# Patient Record
Sex: Female | Born: 1970 | Hispanic: No | Marital: Married
Health system: Southern US, Community
[De-identification: ages and names within clinical notes are randomized; demographics above are authoritative.]

## PROBLEM LIST (undated history)

## (undated) DIAGNOSIS — I10 Essential (primary) hypertension: Secondary | ICD-10-CM

## (undated) DIAGNOSIS — E119 Type 2 diabetes mellitus without complications: Secondary | ICD-10-CM

---

## 2009-01-21 ENCOUNTER — Encounter: Admission: RE | Admit: 2009-01-21 | Discharge: 2009-01-21 | Payer: Self-pay | Admitting: Obstetrics and Gynecology

## 2009-04-27 ENCOUNTER — Ambulatory Visit (HOSPITAL_COMMUNITY): Admission: RE | Admit: 2009-04-27 | Discharge: 2009-04-27 | Payer: Self-pay | Admitting: Obstetrics and Gynecology

## 2009-04-30 ENCOUNTER — Ambulatory Visit (HOSPITAL_COMMUNITY): Admission: RE | Admit: 2009-04-30 | Discharge: 2009-04-30 | Payer: Self-pay | Admitting: Obstetrics and Gynecology

## 2009-05-07 ENCOUNTER — Encounter (INDEPENDENT_AMBULATORY_CARE_PROVIDER_SITE_OTHER): Payer: Self-pay | Admitting: Obstetrics and Gynecology

## 2009-05-07 ENCOUNTER — Inpatient Hospital Stay (HOSPITAL_COMMUNITY): Admission: AD | Admit: 2009-05-07 | Discharge: 2009-05-09 | Payer: Self-pay | Admitting: Obstetrics and Gynecology

## 2010-09-06 LAB — CBC
HCT: 31.2 % — ABNORMAL LOW (ref 36.0–46.0)
HCT: 39.8 % (ref 36.0–46.0)
Hemoglobin: 10 g/dL — ABNORMAL LOW (ref 12.0–15.0)
Hemoglobin: 12.7 g/dL (ref 12.0–15.0)
MCHC: 32 g/dL (ref 30.0–36.0)
MCV: 72.9 fL — ABNORMAL LOW (ref 78.0–100.0)
Platelets: 95 10*3/uL — ABNORMAL LOW (ref 150–400)
RBC: 4.27 MIL/uL (ref 3.87–5.11)
RDW: 16 % — ABNORMAL HIGH (ref 11.5–15.5)
RDW: 16.2 % — ABNORMAL HIGH (ref 11.5–15.5)

## 2010-09-06 LAB — GLUCOSE, CAPILLARY
Glucose-Capillary: 112 mg/dL — ABNORMAL HIGH (ref 70–99)
Glucose-Capillary: 199 mg/dL — ABNORMAL HIGH (ref 70–99)
Glucose-Capillary: 222 mg/dL — ABNORMAL HIGH (ref 70–99)

## 2018-11-10 ENCOUNTER — Emergency Department (HOSPITAL_BASED_OUTPATIENT_CLINIC_OR_DEPARTMENT_OTHER): Payer: PRIVATE HEALTH INSURANCE

## 2018-11-10 ENCOUNTER — Other Ambulatory Visit: Payer: Self-pay

## 2018-11-10 ENCOUNTER — Emergency Department (HOSPITAL_BASED_OUTPATIENT_CLINIC_OR_DEPARTMENT_OTHER)
Admission: EM | Admit: 2018-11-10 | Discharge: 2018-11-10 | Disposition: A | Discharge status: Home / Self Care | Payer: PRIVATE HEALTH INSURANCE | Attending: Emergency Medicine | Admitting: Emergency Medicine

## 2018-11-10 ENCOUNTER — Encounter (HOSPITAL_BASED_OUTPATIENT_CLINIC_OR_DEPARTMENT_OTHER): Payer: Self-pay | Admitting: Emergency Medicine

## 2018-11-10 DIAGNOSIS — S5012XA Contusion of left forearm, initial encounter: Secondary | ICD-10-CM | POA: Diagnosis not present

## 2018-11-10 DIAGNOSIS — E119 Type 2 diabetes mellitus without complications: Secondary | ICD-10-CM | POA: Diagnosis not present

## 2018-11-10 DIAGNOSIS — Y998 Other external cause status: Secondary | ICD-10-CM | POA: Diagnosis not present

## 2018-11-10 DIAGNOSIS — Y93I9 Activity, other involving external motion: Secondary | ICD-10-CM | POA: Insufficient documentation

## 2018-11-10 DIAGNOSIS — Y92410 Unspecified street and highway as the place of occurrence of the external cause: Secondary | ICD-10-CM | POA: Insufficient documentation

## 2018-11-10 DIAGNOSIS — S0083XA Contusion of other part of head, initial encounter: Secondary | ICD-10-CM | POA: Diagnosis not present

## 2018-11-10 DIAGNOSIS — T07XXXA Unspecified multiple injuries, initial encounter: Secondary | ICD-10-CM

## 2018-11-10 DIAGNOSIS — S1091XA Abrasion of unspecified part of neck, initial encounter: Secondary | ICD-10-CM

## 2018-11-10 DIAGNOSIS — S1081XA Abrasion of other specified part of neck, initial encounter: Secondary | ICD-10-CM | POA: Insufficient documentation

## 2018-11-10 DIAGNOSIS — I10 Essential (primary) hypertension: Secondary | ICD-10-CM | POA: Insufficient documentation

## 2018-11-10 DIAGNOSIS — S199XXA Unspecified injury of neck, initial encounter: Secondary | ICD-10-CM | POA: Diagnosis present

## 2018-11-10 HISTORY — DX: Type 2 diabetes mellitus without complications: E11.9

## 2018-11-10 HISTORY — DX: Essential (primary) hypertension: I10

## 2018-11-10 LAB — PREGNANCY, URINE: Preg Test, Ur: NEGATIVE

## 2018-11-10 MED ORDER — CYCLOBENZAPRINE HCL 10 MG PO TABS
10.0000 mg | ORAL_TABLET | Freq: Two times a day (BID) | ORAL | 0 refills | Status: AC | PRN
Start: 2018-11-10 — End: ?
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
